# Patient Record
Sex: Male | Born: 1960 | Hispanic: No | Marital: Married | Smoking: Current every day smoker
Health system: Southern US, Community
[De-identification: ages and names within clinical notes are randomized; demographics above are authoritative.]

## PROBLEM LIST (undated history)

## (undated) DIAGNOSIS — D369 Benign neoplasm, unspecified site: Secondary | ICD-10-CM

## (undated) DIAGNOSIS — K649 Unspecified hemorrhoids: Secondary | ICD-10-CM

## (undated) HISTORY — PX: HEMORRHOID BANDING: SHX5850

## (undated) HISTORY — DX: Benign neoplasm, unspecified site: D36.9

## (undated) HISTORY — PX: OTHER SURGICAL HISTORY: SHX169

---

## 1999-05-25 ENCOUNTER — Encounter: Admission: RE | Admit: 1999-05-25 | Discharge: 1999-05-25 | Payer: Self-pay

## 2002-08-03 ENCOUNTER — Ambulatory Visit (HOSPITAL_COMMUNITY): Admission: RE | Admit: 2002-08-03 | Discharge: 2002-08-03 | Payer: Self-pay | Admitting: General Surgery

## 2007-05-30 ENCOUNTER — Ambulatory Visit (HOSPITAL_COMMUNITY): Admission: RE | Admit: 2007-05-30 | Discharge: 2007-05-30 | Payer: Self-pay | Admitting: Internal Medicine

## 2009-08-01 ENCOUNTER — Emergency Department (HOSPITAL_COMMUNITY): Admission: EM | Admit: 2009-08-01 | Discharge: 2009-08-01 | Payer: Self-pay | Admitting: Emergency Medicine

## 2010-12-01 NOTE — H&P (Signed)
   NAME:  MONTRE, HARBOR NO.:  [id number]   MEDICAL RECORD NO.:  [id number]                   PATIENT TYPE:   LOCATION:                                       FACILITY:   PHYSICIAN:  Dalia Heading, M.D.               DATE OF BIRTH:  [birth year]   DATE OF ADMISSION:  08/03/2002  DATE OF DISCHARGE:                                HISTORY & PHYSICAL   CHIEF COMPLAINT:  Right wrist ganglion cyst.   HISTORY OF PRESENT ILLNESS:  The patient is a 50 year old Hispanic male who  is referred for evaluation and treatment of a right ganglion cyst.  It has  been increasing in size over the past few weeks.  The patient is right  handed and is coming of some discomfort.   PAST MEDICAL HISTORY:  Unremarkable.   PAST SURGICAL HISTORY:  Unremarkable.   CURRENT MEDICATIONS:  None.   ALLERGIES:  No known drug allergies.   REVIEW OF SYSTEMS:  The patient does smoke a pack of cigarettes a day.  He  denies any other cardiopulmonary difficulties or bleeding disorders.   PHYSICAL EXAMINATION:  GENERAL:  The patient is a well-developed, well-  nourished Hispanic male in no acute distress.  VITAL SIGNS:  He is afebrile and vital signs are stable.  LUNGS:  Clear to auscultation with equal breath sounds bilaterally.  HEART:  Regular rate and rhythm without S3, S4, or murmurs.  EXTREMITIES:  Examination reveals a 2-cm soft, oval, cystic mass noted along  the dorsum of the right wrist.   IMPRESSION:  Ganglion cyst, right wrist.    PLAN:  The patient is scheduled for excision of the ganglion cyst, right  wrist, on August 03, 2002.  The risks and benefits of the procedure  including bleeding, infection, and recurrence of the cyst were fully  explained to the patient, gave informed consent.                                               Dalia Heading, M.D.    MAJ/MEDQ  D:  07/30/2002  T:  07/30/2002  Job:  [id number]   cc:   Corrie Mckusick, M.D.  [address]  [place]. [place]  [place]  [place] [address]  Fax: [phone]

## 2010-12-01 NOTE — Op Note (Signed)
   NAME:  Hector Ho, Hector Ho                        ACCOUNT NO.:  [id number]   MEDICAL RECORD NO.:  [id number]                   PATIENT TYPE:  AMB   LOCATION:  DAY                                  FACILITY:  [facility]   PHYSICIAN:  Dalia Heading, M.D.               DATE OF BIRTH:  1960/12/23   DATE OF PROCEDURE:  08/03/2002  DATE OF DISCHARGE:                                 OPERATIVE REPORT   PREOPERATIVE DIAGNOSIS:  Ganglion cyst, right wrist.   POSTOPERATIVE DIAGNOSIS:  Ganglion cyst, right wrist.   OPERATION PERFORMED:  Excision of ganglion cyst, right wrist.   SURGEON:  Dalia Heading, M.D.   ANESTHESIA:  Regional block.   INDICATIONS FOR PROCEDURE:  The patient is a 50 year old Hispanic male who  presents with a ganglion cyst along the posterior aspect of his right wrist.  The patient now comes to the operating room for excision of the ganglion  cyst of the right wrist.  The risks and benefits of the procedure including  bleeding, infection, and recurrence of the cyst were fully explained to the  patient, and he gave informed consent.   DESCRIPTION OF PROCEDURE:  The patient was placed in the supine position  after the right wrist was blocked using a Bier block.  This was performed by  Anesthesia.  The right hand was prepped and draped using the usual sterile  technique with Betadine.   A longitudinal incision was made over the cyst along the dorsal of the right  hand.  This was taken down to the base of the ganglion cyst.  A 5-0 nylon  suture ligature was placed at the base of the ganglion cyst.  The remaining  cystic tissue was then excised.  The wound was irrigated with normal saline.  The wound was injected with 0.5% Sensorcaine.  The skin was closed using a 5-  0 Vicryl subcuticular suture.  Steri-Strips and dry sterile dressing were  applied.  All tape and needle counts correct at the end of the procedure.  The patient was transferred to day surgery in stable  condition.   COMPLICATIONS:  None.   SPECIMENS:  Ganglion cyst, right wrist.    ESTIMATED BLOOD LOSS:  Minimal.                                                Dalia Heading, M.D.    MAJ/MEDQ  D:  08/03/2002  T:  08/03/2002  Job:  [id number]   cc:   Corrie Mckusick, M.D.  [address] [place]. [place]  [place]  [place] [address]  Fax: [phone]
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
## 2012-11-19 ENCOUNTER — Encounter (HOSPITAL_COMMUNITY): Payer: Self-pay

## 2012-11-19 ENCOUNTER — Ambulatory Visit (HOSPITAL_COMMUNITY)
Admission: RE | Admit: 2012-11-19 | Discharge: 2012-11-19 | Disposition: A | Discharge status: Home / Self Care | Payer: BC Managed Care – PPO | Source: Ambulatory Visit | Attending: Cardiovascular Disease | Admitting: Cardiovascular Disease

## 2012-11-19 DIAGNOSIS — R079 Chest pain, unspecified: Secondary | ICD-10-CM | POA: Insufficient documentation

## 2012-11-19 NOTE — Progress Notes (Signed)
Stress Lab Nurses Notes - Hector Ho  Hector Ho 11/19/2012 Reason for doing test: Chest Pain Type of test: Regular GTX Nurse performing test: Parke Poisson, RN Nuclear Medicine Tech: Not Applicable Echo Tech: Not Applicable MD performing test: Dr. Royann Shivers Family MD: Dr. Sherwood Gambler Test explained and consent signed: yes IV started: No IV started Symptoms: mild SOB Treatment/Intervention: None Reason test stopped: fatigue After recovery IV was: NA Patient to return to Nuc. Med at : NA Patient discharged: Home Patient's Condition upon discharge was: stable Comments: During test peak BP 146/97 & HR 144.  Recovery BP 115/74 & HR 88.  Symptoms resolved in recovery. [name] T

## 2012-11-19 NOTE — Procedures (Signed)
Procedure report  Exercise tolerance test  Procedure performed ZO:XWRUE Annaelle Kasel, MD, [facility]  Reason for procedure: Chest pain  The patient was initially monitored at rest and the heart rate was 59 beats per minute and the blood pressure was 110/70 mm Hg. An echocardiogram showed normal sinus rhythm without ST segment deviation signs of previous myocardial infarction or chamber enlargement  The patient underwent exercise treadmill stress testing using the standardized Bruce protocol. He exercised for a total of 12 minutes and 22 seconds, achieving a maximum workload of 14.7 METS. The peak heart rate was 144 beats per minute, representing 85% of the maximum predicted heart rate. The study was interrupted due to leg fatigue. Patient did not describe any anginal chest pain or dyspnea during the study. The blood pressure at peak exercise was 147/97 mm Hg  The electrocardiogram did not show any meaningful ST segment deviation.  Conclusion:  1. good exercise tolerance. 2. hypertensive response to exercise. 3. no electrocardiographic changes with exercise 4. no evidence of exercise-induced ischemia by electrocardiographic monitoring  Normal stress test.  Thurmon Fair, MD, [facility] [phone] office [phone] pager

## 2014-02-04 ENCOUNTER — Ambulatory Visit (HOSPITAL_COMMUNITY)
Admission: RE | Admit: 2014-02-04 | Discharge: 2014-02-04 | Disposition: A | Discharge status: Home / Self Care | Payer: BC Managed Care – PPO | Source: Ambulatory Visit | Attending: Internal Medicine | Admitting: Internal Medicine

## 2014-02-04 ENCOUNTER — Other Ambulatory Visit (HOSPITAL_COMMUNITY): Payer: Self-pay | Admitting: Internal Medicine

## 2014-02-04 DIAGNOSIS — R05 Cough: Secondary | ICD-10-CM

## 2014-02-04 DIAGNOSIS — Z0000 Encounter for general adult medical examination without abnormal findings: Secondary | ICD-10-CM | POA: Insufficient documentation

## 2014-02-04 DIAGNOSIS — R059 Cough, unspecified: Secondary | ICD-10-CM

## 2014-02-16 ENCOUNTER — Telehealth: Payer: Self-pay

## 2014-02-16 ENCOUNTER — Other Ambulatory Visit: Payer: Self-pay

## 2014-02-16 DIAGNOSIS — Z1211 Encounter for screening for malignant neoplasm of colon: Secondary | ICD-10-CM

## 2014-02-16 NOTE — Telephone Encounter (Signed)
Pt is calling to get her TCS set up. Her call back number is [phone].

## 2014-02-16 NOTE — Telephone Encounter (Signed)
Appropriate.

## 2014-02-16 NOTE — Telephone Encounter (Signed)
Gastroenterology Pre-Procedure Review  Request Date: 02/16/2014 Requesting Physician: Dr. Gerarda Fraction  PATIENT REVIEW QUESTIONS: The patient responded to the following health history questions as indicated:    1. Diabetes Melitis: Yes    DIET CONTROLLED 2. Joint replacements in the past 12 months: no 3. Major health problems in the past 3 months: no 4. Has an artificial valve or MVP: no 5. Has a defibrillator: no 6. Has been advised in past to take antibiotics in advance of a procedure like teeth cleaning: no    MEDICATIONS & ALLERGIES:    Patient reports the following regarding taking any blood thinners:   Plavix? no Aspirin? no Coumadin? no  Patient confirms/reports the following medications:  Current Outpatient Prescriptions  Medication Sig Dispense Refill  . ALPRAZolam (XANAX) 0.5 MG tablet Take 0.5 mg by mouth at bedtime as needed for anxiety. Has on hand if needed. Seldom uses it      . HYDROcodone-acetaminophen (NORCO) 10-325 MG per tablet Take 1 tablet by mouth every 6 (six) hours as needed. Has on hand if needed     Does not take often       No current facility-administered medications for this visit.    Patient confirms/reports the following allergies:  No Known Allergies  No orders of the defined types were placed in this encounter.    AUTHORIZATION INFORMATION Primary Insurance:   ID #:   Group #:  Pre-Cert / Auth required:  Pre-Cert / Auth #:   Secondary Insurance:   ID #:   Group #:  Pre-Cert / Auth required: Pre-Cert / Auth #:   SCHEDULE INFORMATION: Procedure has been scheduled as follows:  Date: 03/08/2014                  Time:  8:30 am Location: [facility] Short Stay  This Gastroenterology Pre-Precedure Review Form is being routed to the following provider(s): R. Garfield Cornea, MD

## 2014-02-18 ENCOUNTER — Encounter (HOSPITAL_COMMUNITY): Payer: Self-pay

## 2014-02-18 MED ORDER — PEG-KCL-NACL-NASULF-NA ASC-C 100 G PO SOLR: 1.0000 | ORAL | Status: DC

## 2014-02-18 NOTE — Telephone Encounter (Signed)
Rx sent to the pharmacy and instructions mailed to pt.  

## 2014-03-08 ENCOUNTER — Encounter (HOSPITAL_COMMUNITY)
Admission: RE | Disposition: A | Discharge status: Home / Self Care | Payer: Self-pay | Source: Ambulatory Visit | Attending: Internal Medicine

## 2014-03-08 ENCOUNTER — Encounter: Payer: Self-pay | Admitting: Internal Medicine

## 2014-03-08 ENCOUNTER — Ambulatory Visit (HOSPITAL_COMMUNITY)
Admission: RE | Admit: 2014-03-08 | Discharge: 2014-03-08 | Disposition: A | Discharge status: Home / Self Care | Payer: BC Managed Care – PPO | Source: Ambulatory Visit | Attending: Internal Medicine | Admitting: Internal Medicine

## 2014-03-08 ENCOUNTER — Encounter (HOSPITAL_COMMUNITY): Payer: Self-pay | Admitting: *Deleted

## 2014-03-08 DIAGNOSIS — F17200 Nicotine dependence, unspecified, uncomplicated: Secondary | ICD-10-CM | POA: Diagnosis not present

## 2014-03-08 DIAGNOSIS — D126 Benign neoplasm of colon, unspecified: Secondary | ICD-10-CM

## 2014-03-08 DIAGNOSIS — F411 Generalized anxiety disorder: Secondary | ICD-10-CM | POA: Insufficient documentation

## 2014-03-08 DIAGNOSIS — Z79899 Other long term (current) drug therapy: Secondary | ICD-10-CM | POA: Diagnosis not present

## 2014-03-08 DIAGNOSIS — K648 Other hemorrhoids: Secondary | ICD-10-CM | POA: Diagnosis not present

## 2014-03-08 DIAGNOSIS — Z86010 Personal history of colonic polyps: Secondary | ICD-10-CM

## 2014-03-08 DIAGNOSIS — Z1211 Encounter for screening for malignant neoplasm of colon: Secondary | ICD-10-CM

## 2014-03-08 DIAGNOSIS — K921 Melena: Secondary | ICD-10-CM | POA: Diagnosis not present

## 2014-03-08 HISTORY — DX: Unspecified hemorrhoids: K64.9

## 2014-03-08 HISTORY — PX: COLONOSCOPY: SHX5424

## 2014-03-08 SURGERY — COLONOSCOPY
Anesthesia: Moderate Sedation

## 2014-03-08 MED ORDER — MIDAZOLAM HCL 5 MG/5ML IJ SOLN
INTRAMUSCULAR | Status: DC | PRN
  Administered 2014-03-08 (×2): 2 mg via INTRAVENOUS

## 2014-03-08 MED ORDER — MEPERIDINE HCL 100 MG/ML IJ SOLN
INTRAMUSCULAR | Status: AC
  Filled 2014-03-08: qty 2

## 2014-03-08 MED ORDER — STERILE WATER FOR IRRIGATION IR SOLN
Status: DC | PRN
  Administered 2014-03-08: 08:00:00

## 2014-03-08 MED ORDER — MIDAZOLAM HCL 5 MG/5ML IJ SOLN
INTRAMUSCULAR | Status: AC
  Filled 2014-03-08: qty 10

## 2014-03-08 MED ORDER — SODIUM CHLORIDE 0.9 % IV SOLN
INTRAVENOUS | Status: DC
  Administered 2014-03-08: 08:00:00 via INTRAVENOUS

## 2014-03-08 MED ORDER — MEPERIDINE HCL 100 MG/ML IJ SOLN
INTRAMUSCULAR | Status: DC | PRN
  Administered 2014-03-08: 50 mg via INTRAVENOUS
  Administered 2014-03-08: 25 mg via INTRAVENOUS

## 2014-03-08 MED ORDER — ONDANSETRON HCL 4 MG/2ML IJ SOLN
INTRAMUSCULAR | Status: AC
  Filled 2014-03-08: qty 2

## 2014-03-08 MED ORDER — ONDANSETRON HCL 4 MG/2ML IJ SOLN
INTRAMUSCULAR | Status: DC | PRN
  Administered 2014-03-08: 4 mg via INTRAVENOUS

## 2014-03-08 NOTE — H&P (Signed)
@  VV[facility]@   Primary Care Physician:  Glo Herring., MD Primary Gastroenterologist:  Dr. Gala Romney  Pre-Procedure History & Physical: HPI:  Hector Ho is a 53 y.o. male is here for a screening colonoscopy. Occasional paper hematochezia; otherwise, no bowel symptoms. No family history of colon cancer. No prior colonoscopy  Past Medical History  Diagnosis Date  . Hemorrhoids     Past Surgical History  Procedure Laterality Date  . Cyst removed from right hand      Prior to Admission medications   Medication Sig Start Date End Date Taking? Authorizing Provider  ALPRAZolam Duanne Moron) 0.5 MG tablet Take 0.5 mg by mouth at bedtime as needed for anxiety.    Yes Historical Provider, MD  ciprofloxacin (CIPRO) 500 MG tablet Take 500 mg by mouth 2 (two) times daily. For 14 days, started on 02/04/2014   Yes Historical Provider, MD  HYDROcodone-acetaminophen (NORCO) 10-325 MG per tablet Take 1 tablet by mouth every 6 (six) hours as needed for moderate pain.    Yes Historical Provider, MD    Allergies as of 02/16/2014  . (No Known Allergies)    Family History  Problem Relation Age of Onset  . Family history unknown: Yes    History   Social History  . Marital Status: Married    Spouse Name: N/A    Number of Children: N/A  . Years of Education: N/A   Occupational History  . Not on file.   Social History Main Topics  . Smoking status: Current Every Day Smoker -- 0.50 packs/day for 41 years    Types: Cigarettes  . Smokeless tobacco: Not on file  . Alcohol Use: No  . Drug Use: No  . Sexual Activity: Not on file   Other Topics Concern  . Not on file   Social History Narrative  . No narrative on file    Review of Systems: See HPI, otherwise negative ROS  Physical Exam: BP 115/78  Pulse 67  Temp(Src) 98.2 F (36.8 C) (Oral)  Resp 20  Ht 5\' 8"  (1.727 m)  Wt 153 lb (69.4 kg)  BMI 23.27 kg/m2  SpO2 99% General:   Alert,  Well-developed, well-nourished, pleasant and  cooperative in NAD Head:  Normocephalic and atraumatic. Eyes:  Sclera clear, no icterus.   Conjunctiva pink. Ears:  Normal auditory acuity. Nose:  No deformity, discharge,  or lesions. Mouth:  No deformity or lesions, dentition normal. Neck:  Supple; no masses or thyromegaly. Lungs:  Clear throughout to auscultation.   No wheezes, crackles, or rhonchi. No acute distress. Heart:  Regular rate and rhythm; no murmurs, clicks, rubs,  or gallops. Abdomen:  Soft, nontender and nondistended. No masses, hepatosplenomegaly or hernias noted. Normal bowel sounds, without guarding, and without rebound.   Msk:  Symmetrical without gross deformities. Normal posture. Pulses:  Normal pulses noted. Extremities:  Without clubbing or edema. Neurologic:  Alert and  oriented x4;  grossly normal neurologically. Skin:  Intact without significant lesions or rashes. Cervical Nodes:  No significant cervical adenopathy. Psych:  Alert and cooperative. Normal mood and affect.  Impression/Plan: Hector Ho is now here to undergo a screening colonoscopy. Paper hematochezia. Risks, benefits, limitations, imponderables and alternatives regarding colonoscopy have been reviewed with the patient. Questions have been answered. All parties agreeable.     Notice:  This dictation was prepared with Dragon dictation along with smaller phrase technology. Any transcriptional errors that result from this process are unintentional and may not be corrected upon review.

## 2014-03-08 NOTE — Op Note (Signed)
[facility] [address] [place], [address]   COLONOSCOPY PROCEDURE REPORT  PATIENT: Ho, Hector  MR#:         [id number] BIRTHDATE: 08-25-1960 , 53  yrs. old GENDER: Male ENDOSCOPIST: R.  Garfield Cornea, MD FACP FACG REFERRED BY:  Kerin Perna, M.D. PROCEDURE DATE:  03/08/2014 PROCEDURE:     Colonoscopy with snare polypectomy  INDICATIONS: First ever average risk screening colonoscopy; paper hematochezia  INFORMED CONSENT:  The risks, benefits, alternatives and imponderables including but not limited to bleeding, perforation as well as the possibility of a missed lesion have been reviewed.  The potential for biopsy, lesion removal, etc. have also been discussed.  Questions have been answered.  All parties agreeable. Please see the history and physical in the medical record for more information.  MEDICATIONS: Versed 4 mg IV and Demerol 75 mg IV in divided doses. Zofran 4 mg IV.  DESCRIPTION OF PROCEDURE:  After a digital rectal exam was performed, the EC-[id number] ([id number])  colonoscope was advanced from the anus through the rectum and colon to the area of the cecum, ileocecal valve and appendiceal orifice.  The cecum was deeply intubated.  These structures were well-seen and photographed for the record.  From the level of the cecum and ileocecal valve, the scope was slowly and cautiously withdrawn.  The mucosal surfaces were carefully surveyed utilizing scope tip deflection to facilitate fold flattening as needed.  The scope was pulled down into the rectum where a thorough examination including retroflexion was performed.    FINDINGS:  Adequate preparation.   excoriated internal hemorrhoids; otherwise normal rectum (1) 6 mm sessile polyp at splenic flexure; otherwise, the remainder of colonic mucosa appeared normal.  THERAPEUTIC / DIAGNOSTIC MANEUVERS PERFORMED:  The above-mentioned polyp was hot snare removed and recovered for the  pathologist  COMPLICATIONS: none  CECAL WITHDRAWAL TIME:  9 minutes  IMPRESSION:  Excoriated anal canal/internal hemorrhoids-likely source of hematochezia. Single colonic polyp-removed as described above  RECOMMENDATIONS:  Followup on pathology. Office visit in 3-4 weeks to embark on hemorrhoid banding.   _______________________________ eSigned:  R. Garfield Cornea, MD FACP [facility] 03/08/2014 9:10 AM   CC:

## 2014-03-08 NOTE — Discharge Instructions (Signed)
Colonoscopy Discharge Instructions  Read the instructions outlined below and refer to this sheet in the next few weeks. These discharge instructions provide you with general information on caring for yourself after you leave the hospital. Your doctor may also give you specific instructions. While your treatment has been planned according to the most current medical practices available, unavoidable complications occasionally occur. If you have any problems or questions after discharge, call Dr. Gala Romney at [phone]. ACTIVITY  You may resume your regular activity, but move at a slower pace for the next 24 hours.   Take frequent rest periods for the next 24 hours.   Walking will help get rid of the air and reduce the bloated feeling in your belly (abdomen).   No driving for 24 hours (because of the medicine (anesthesia) used during the test).    Do not sign any important legal documents or operate any machinery for 24 hours (because of the anesthesia used during the test).  NUTRITION  Drink plenty of fluids.   You may resume your normal diet as instructed by your doctor.   Begin with a light meal and progress to your normal diet. Heavy or fried foods are harder to digest and may make you feel sick to your stomach (nauseated).   Avoid alcoholic beverages for 24 hours or as instructed.  MEDICATIONS  You may resume your normal medications unless your doctor tells you otherwise.  WHAT YOU CAN EXPECT TODAY  Some feelings of bloating in the abdomen.   Passage of more gas than usual.   Spotting of blood in your stool or on the toilet paper.  IF YOU HAD POLYPS REMOVED DURING THE COLONOSCOPY:  No aspirin products for 7 days or as instructed.   No alcohol for 7 days or as instructed.   Eat a soft diet for the next 24 hours.  FINDING OUT THE RESULTS OF YOUR TEST Not all test results are available during your visit. If your test results are not back during the visit, make an appointment  with your caregiver to find out the results. Do not assume everything is normal if you have not heard from your caregiver or the medical facility. It is important for you to follow up on all of your test results.  SEEK IMMEDIATE MEDICAL ATTENTION IF:  You have more than a spotting of blood in your stool.   Your belly is swollen (abdominal distention).   You are nauseated or vomiting.   You have a temperature over 101.   You have abdominal pain or discomfort that is severe or gets worse throughout the day.    Hemorrhoids Hemorrhoids are puffy (swollen) veins around the rectum or anus. Hemorrhoids can cause pain, itching, bleeding, or irritation. HOME CARE  Eat foods with fiber, such as whole grains, beans, nuts, fruits, and vegetables. Ask your doctor about taking products with added fiber in them (fibersupplements).  Drink enough fluid to keep your pee (urine) clear or pale yellow.  Exercise often.  Go to the bathroom when you have the urge to poop. Do not wait.  Avoid straining to poop (bowel movement).  Keep the butt area dry and clean. Use wet toilet paper or moist paper towels.  Medicated creams and medicine inserted into the anus (anal suppository) may be used or applied as told.  Only take medicine as told by your doctor.  Take a warm water bath (sitz bath) for 15-20 minutes to ease pain. Do this 3-4 times a day.  Place  ice packs on the area if it is tender or puffy. Use the ice packs between the warm water baths.  Put ice in a plastic bag.  Place a towel between your skin and the bag.  Leave the ice on for 15-20 minutes, 03-04 times a day.  Do not use a donut-shaped pillow or sit on the toilet for a long time. GET HELP RIGHT AWAY IF:   You have more pain that is not controlled by treatment or medicine.  You have bleeding that will not stop.  You have trouble or are unable to poop (bowel movement).  You have pain or puffiness outside the area of the  hemorrhoids. MAKE SURE YOU:   Understand these instructions.  Will watch your condition.  Will get help right away if you are not doing well or get worse. Document Released: 04/10/2008 Document Revised: 06/18/2012 Document Reviewed: 05/13/2012 [facility] Patient Information 2015 [place], [place]. This information is not intended to replace advice given to you by your health care provider. Make sure you discuss any questions you have with your health care provider. Colon Polyps Polyps are lumps of extra tissue growing inside the body. Polyps can grow in the large intestine (colon). Most colon polyps are noncancerous (benign). However, some colon polyps can become cancerous over time. Polyps that are larger than a pea may be harmful. To be safe, caregivers remove and test all polyps. CAUSES  Polyps form when mutations in the genes cause your cells to grow and divide even though no more tissue is needed. RISK FACTORS There are a number of risk factors that can increase your chances of getting colon polyps. They include:  Being older than 50 years.  Family history of colon polyps or colon cancer.  Long-term colon diseases, such as colitis or Crohn disease.  Being overweight.  Smoking.  Being inactive.  Drinking too much alcohol. SYMPTOMS  Most small polyps do not cause symptoms. If symptoms are present, they may include:  Blood in the stool. The stool may look dark red or black.  Constipation or diarrhea that lasts longer than 1 week. DIAGNOSIS People often do not know they have polyps until their caregiver finds them during a regular checkup. Your caregiver can use 4 tests to check for polyps:  Digital rectal exam. The caregiver wears gloves and feels inside the rectum. This test would find polyps only in the rectum.  Barium enema. The caregiver puts a liquid called barium into your rectum before taking X-rays of your colon. Barium makes your colon look white. Polyps are dark, so  they are easy to see in the X-ray pictures.  Sigmoidoscopy. A thin, flexible tube (sigmoidoscope) is placed into your rectum. The sigmoidoscope has a light and tiny camera in it. The caregiver uses the sigmoidoscope to look at the last third of your colon.  Colonoscopy. This test is like sigmoidoscopy, but the caregiver looks at the entire colon. This is the most common method for finding and removing polyps. TREATMENT  Any polyps will be removed during a sigmoidoscopy or colonoscopy. The polyps are then tested for cancer. PREVENTION  To help lower your risk of getting more colon polyps:  Eat plenty of fruits and vegetables. Avoid eating fatty foods.  Do not smoke.  Avoid drinking alcohol.  Exercise every day.  Lose weight if recommended by your caregiver.  Eat plenty of calcium and folate. Foods that are rich in calcium include milk, cheese, and broccoli. Foods that are rich in folate include  chickpeas, kidney beans, and spinach. HOME CARE INSTRUCTIONS Keep all follow-up appointments as directed by your caregiver. You may need periodic exams to check for polyps. SEEK MEDICAL CARE IF: You notice bleeding during a bowel movement. Document Released: 03/28/2004 Document Revised: 09/24/2011 Document Reviewed: 09/11/2011 [facility] Patient Information 2015 [place], [place]. This information is not intended to replace advice given to you by your health care provider. Make sure you discuss any questions you have with your health care provider.  Polyp and hemorrhoid information provided  Further recommendations to follow pending review of pathology report.  Schedule first hemorrhoid banding session with me in the office in about 3-4 weeks from now

## 2014-03-11 ENCOUNTER — Encounter (HOSPITAL_COMMUNITY): Payer: Self-pay | Admitting: Internal Medicine

## 2014-03-14 ENCOUNTER — Encounter: Payer: Self-pay | Admitting: Internal Medicine

## 2014-04-06 ENCOUNTER — Encounter: Payer: Self-pay | Admitting: Internal Medicine

## 2014-04-06 ENCOUNTER — Ambulatory Visit (INDEPENDENT_AMBULATORY_CARE_PROVIDER_SITE_OTHER): Payer: BC Managed Care – PPO | Admitting: Internal Medicine

## 2014-04-06 VITALS — BP 114/73 | HR 77 | Temp 97.4°F | Ht 68.0 in | Wt 158.2 lb

## 2014-04-06 DIAGNOSIS — K648 Other hemorrhoids: Secondary | ICD-10-CM

## 2014-04-06 NOTE — Progress Notes (Signed)
Robbins banding procedure note:  The patient presents with symptomatic grade 2 hemorrhoids unresponsive to maximal medical therapy requesting rubber band ligation of his/her hemorrhoidal disease. All risks, benefits, and alternative forms of therapy were described and informed consent was obtained.  In the left lateral decubitus position, a digital rectal exam was performed.  The decision was made to band the Right anterior and left lateral internal hemorrhoid the [place] was used to perform band ligation without complication. Digital anorectal examination was then performed to assure proper positioning of the band; bands were found to be in excellent position. A pitcher pain after deployment. The patient was discharged home without pain or other issues. Dietary and behavioral recommendations were given.  Office visit in 3-4 weeks.  No complications were encountered and the patient tolerated the procedure well.

## 2014-04-06 NOTE — Patient Instructions (Signed)
Avoid straining.  Benefiber 2 teaspoons twice daily  Limit toilet time to 2-3 minutes  Call with any interim problems  Schedule followup appointment in 2-3 weeks from now   

## 2014-04-12 ENCOUNTER — Telehealth (INDEPENDENT_AMBULATORY_CARE_PROVIDER_SITE_OTHER): Payer: Self-pay | Admitting: Internal Medicine

## 2014-04-12 MED ORDER — HYDROCORTISONE ACETATE 25 MG RE SUPP: 25.0000 mg | Freq: Every day | RECTAL | Status: DC

## 2014-04-12 NOTE — Telephone Encounter (Signed)
Noted. Let's call and check on the patient today please

## 2014-04-12 NOTE — Telephone Encounter (Signed)
Patient's wife called yesterday stating patient was having rectal pain and bleeding would not come to emergency room. Patient had been doing recently by Dr. Gala Romney. I told patient that I would send prescription for [facility] suppositories to [place].

## 2014-04-15 NOTE — Telephone Encounter (Signed)
Late entry, I have been unable to get in touch with the pt. Finally talked to the wife today and she said the pt wasn't home and she has been sick and hasnt talked to him so she is not sure how he is.

## 2014-05-04 ENCOUNTER — Encounter: Payer: BC Managed Care – PPO | Admitting: Internal Medicine

## 2014-05-04 ENCOUNTER — Encounter: Payer: Self-pay | Admitting: Internal Medicine

## 2015-02-03 ENCOUNTER — Encounter: Payer: Self-pay | Admitting: *Deleted

## 2015-03-10 ENCOUNTER — Encounter: Payer: Self-pay | Admitting: Internal Medicine

## 2016-01-23 IMAGING — CR DG CHEST 2V
2 series · 2 of 2 positions shown · non-contrast
Comparison: None.

CLINICAL DATA: Physical exam

EXAM:
CHEST  2 VIEW

[view not recorded (1 of 2)]
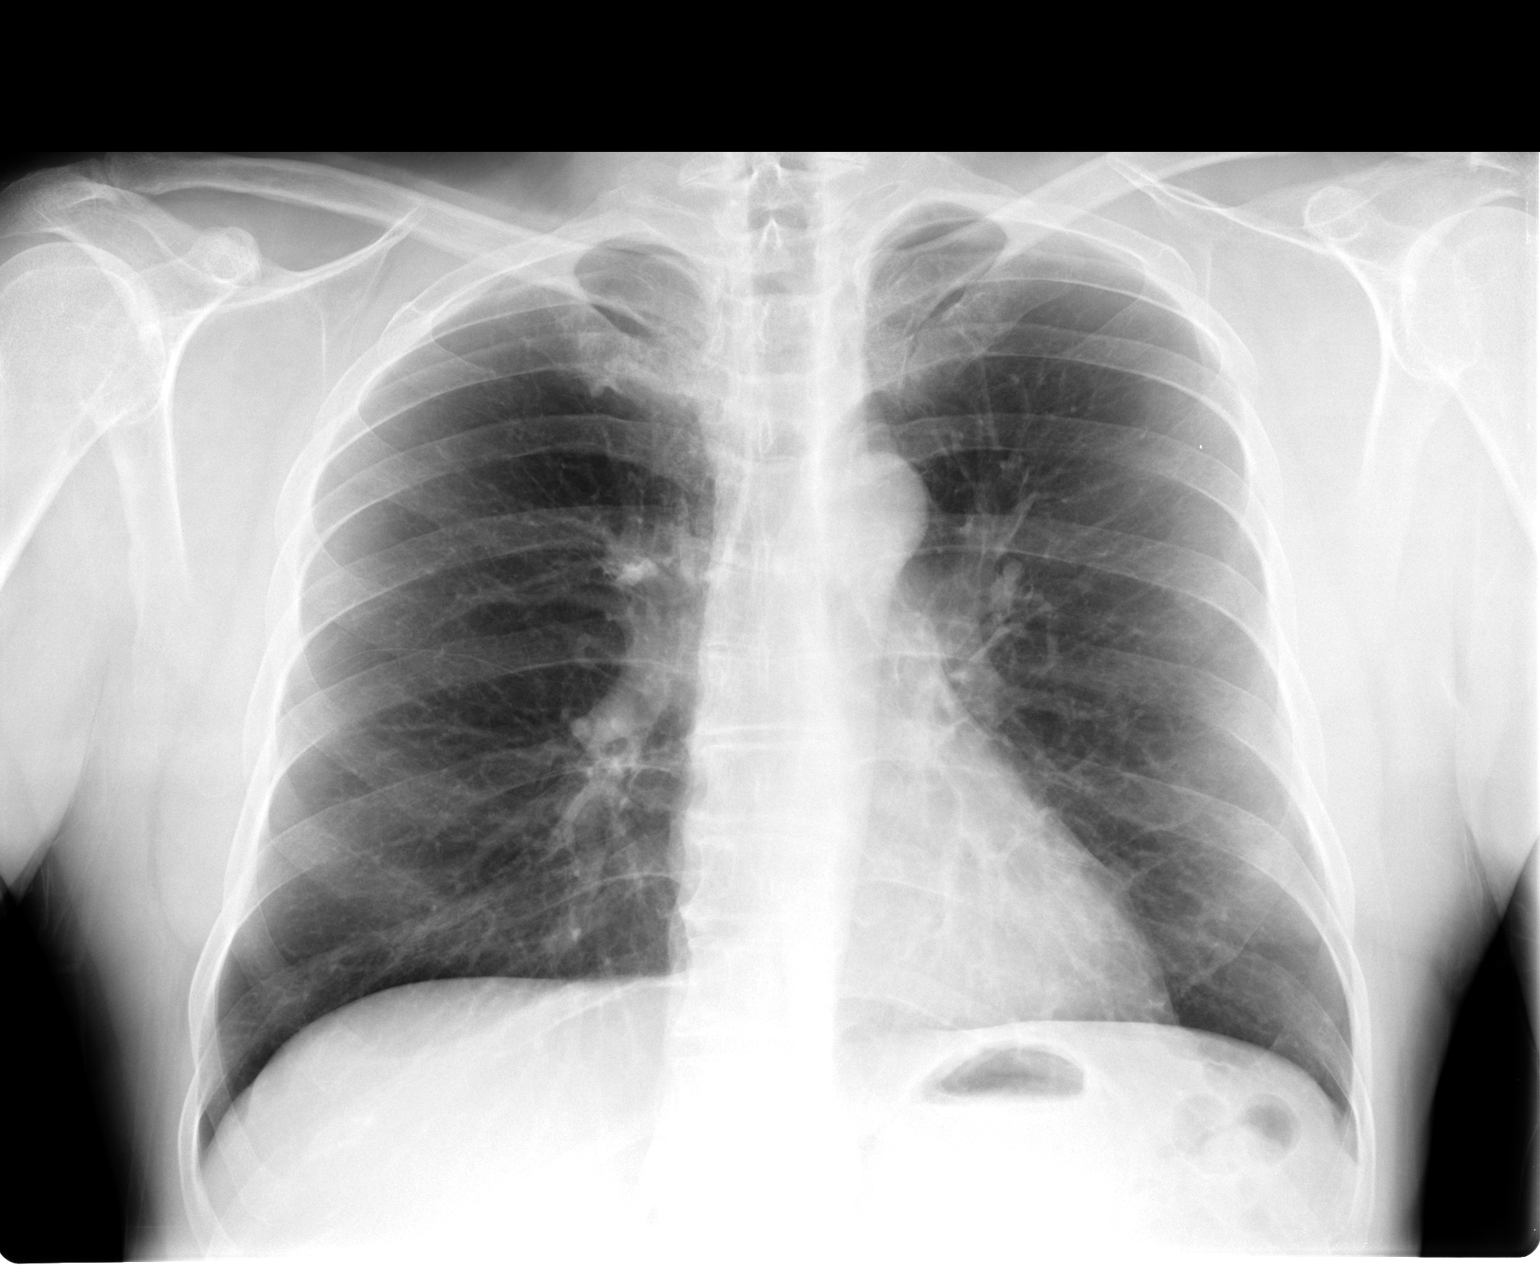

[view not recorded (2 of 2)]
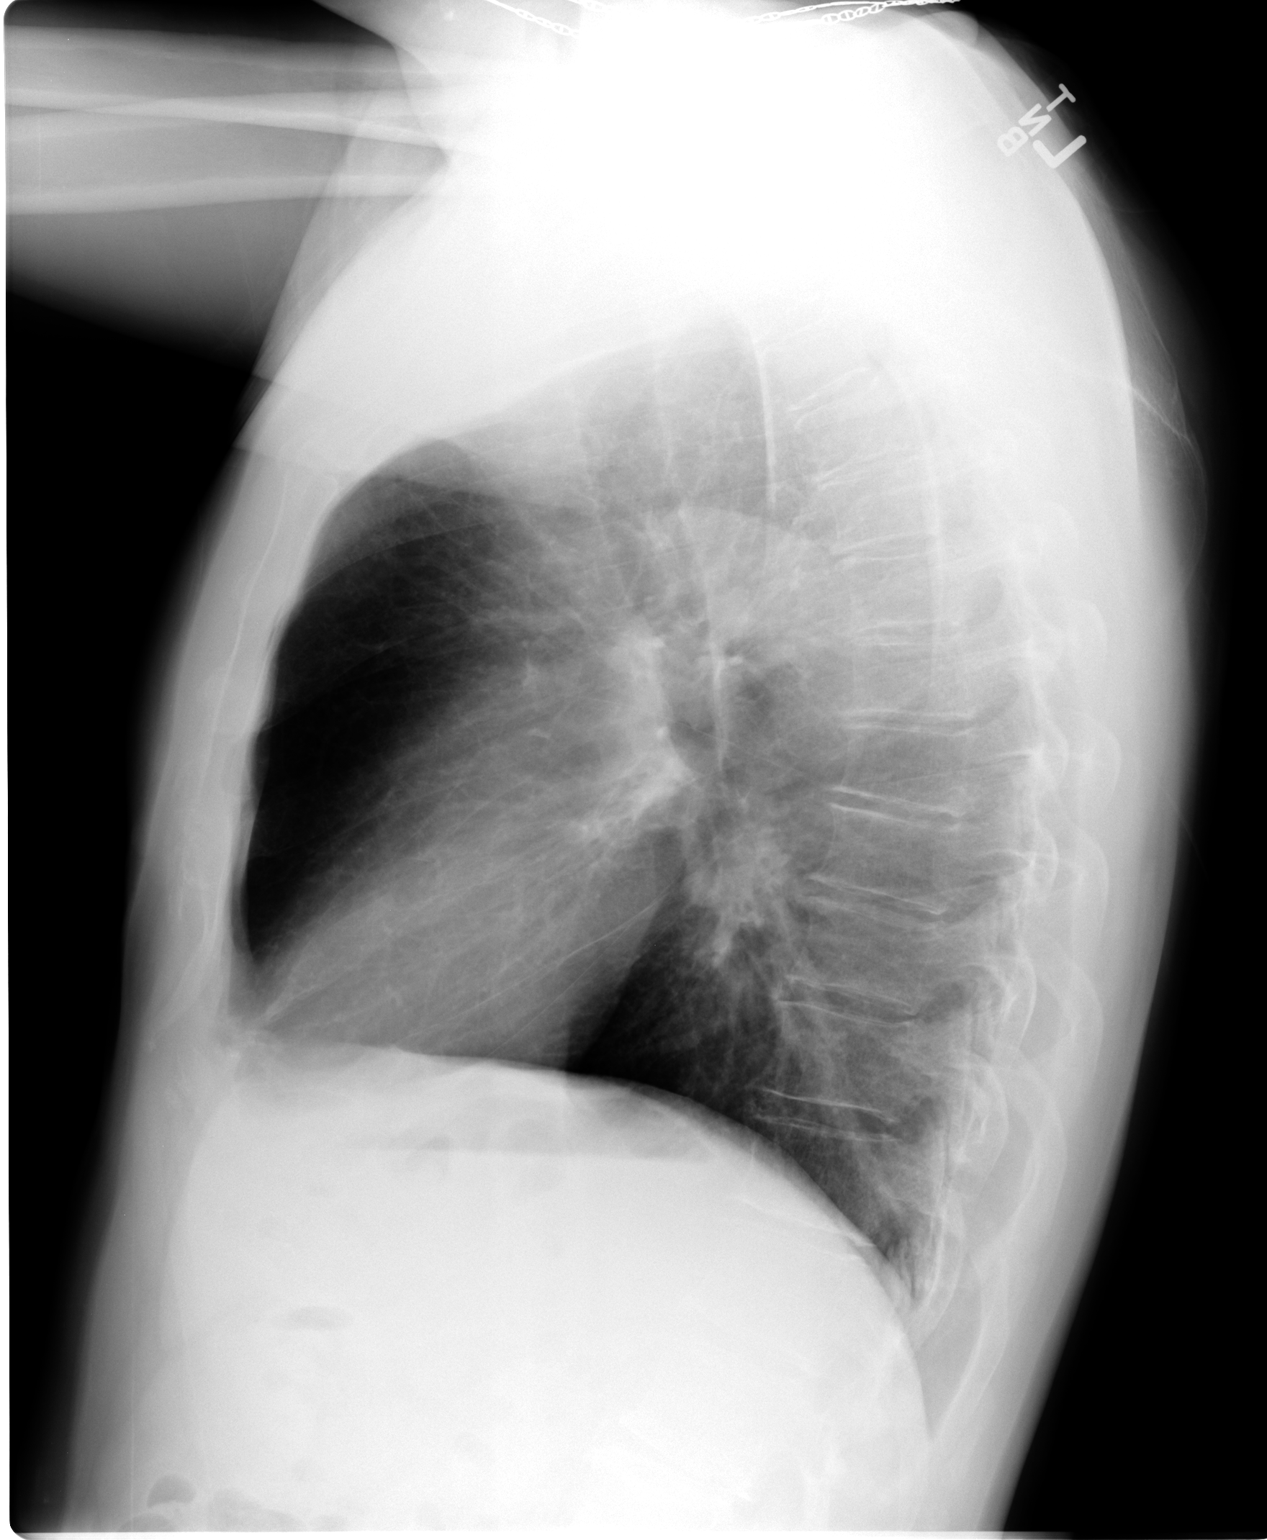

[2 of 2 positions shown; findings below may reference images not displayed]

FINDINGS: The heart size and mediastinal contours are within normal limits.
Both lungs are clear. Minimal degenerative changes mid thoracic
spine.
IMPRESSION: No active cardiopulmonary disease.

## 2018-05-02 DIAGNOSIS — G894 Chronic pain syndrome: Secondary | ICD-10-CM | POA: Diagnosis not present

## 2018-05-02 DIAGNOSIS — Z0001 Encounter for general adult medical examination with abnormal findings: Secondary | ICD-10-CM | POA: Diagnosis not present

## 2018-05-02 DIAGNOSIS — L209 Atopic dermatitis, unspecified: Secondary | ICD-10-CM | POA: Diagnosis not present

## 2018-05-02 DIAGNOSIS — Z6824 Body mass index (BMI) 24.0-24.9, adult: Secondary | ICD-10-CM | POA: Diagnosis not present

## 2021-02-06 ENCOUNTER — Encounter: Payer: Self-pay | Admitting: *Deleted

## 2021-08-01 ENCOUNTER — Encounter: Payer: Self-pay | Admitting: Gastroenterology

## 2021-08-01 ENCOUNTER — Encounter: Payer: Self-pay | Admitting: Internal Medicine

## 2023-08-20 ENCOUNTER — Ambulatory Visit: Payer: Self-pay | Admitting: Orthopedic Surgery

## 2023-09-13 ENCOUNTER — Ambulatory Visit (INDEPENDENT_AMBULATORY_CARE_PROVIDER_SITE_OTHER): Payer: Self-pay | Admitting: Orthopedic Surgery

## 2023-09-13 ENCOUNTER — Encounter: Payer: Self-pay | Admitting: Orthopedic Surgery

## 2023-09-13 ENCOUNTER — Other Ambulatory Visit: Payer: Self-pay

## 2023-09-13 VITALS — BP 157/107 | HR 73 | Ht 67.0 in | Wt 157.0 lb

## 2023-09-13 DIAGNOSIS — M25562 Pain in left knee: Secondary | ICD-10-CM

## 2023-09-13 DIAGNOSIS — S83242A Other tear of medial meniscus, current injury, left knee, initial encounter: Secondary | ICD-10-CM

## 2023-09-13 NOTE — Progress Notes (Signed)
  Intake history:  Ht 5\' 7"  (1.702 m)   Wt 157 lb (71.2 kg)   BMI 24.59 kg/m  Body mass index is 24.59 kg/m.    WHAT ARE WE SEEING YOU FOR TODAY?   left knee(s)  How long has this bothered you? (DOI?DOS?WS?)  5weeks   Anticoag.  No  Diabetes No/ unknown does not go to the doctor  Heart disease No  Hypertension No unknown does not go to the doctor BP high today patient advised   SMOKING HX Yes  Kidney disease No  Any ALLERGIES ______________ Allergies  Allergen Reactions   Hydrocodone    ________________________________   Treatment:  Have you taken:  Tylenol No  Advil Yes  Had PT No  Had injection No  Other  _________________patches ________

## 2023-09-13 NOTE — Progress Notes (Signed)
   Subjective:    Patient ID: Hector Ho, male    DOB: September 14, 1960, 63 y.o.   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  He comes in complaining of medial knee pain occasionally this radiates to his hip with frequent giving way loss of full flexion unrelieved by Advil and topical pain patches  Knee Pain       Review of Systems  All other systems reviewed and are negative.      Objective:   Physical Exam Vitals and nursing note reviewed.  Constitutional:      Appearance: Normal appearance.  HENT:     Head: Normocephalic and atraumatic.  Eyes:     General: No scleral icterus.       Right eye: No discharge.        Left eye: No discharge.     Extraocular Movements: Extraocular movements intact.     Conjunctiva/sclera: Conjunctivae normal.     Pupils: Pupils are equal, round, and reactive to light.  Cardiovascular:     Rate and Rhythm: Normal rate.     Pulses: Normal pulses.  Musculoskeletal:     Comments: He does have an effusion in the left knee the skin is normal  He is tender over the medial joint line is a positive McMurray's sign for medial meniscus tear his knee is stable muscle tone is normal  Altered gait  Skin:    General: Skin is warm and dry.     Capillary Refill: Capillary refill takes less than 2 seconds.  Neurological:     General: No focal deficit present.     Mental Status: He is alert and oriented to person, place, and time.  Psychiatric:        Mood and Affect: Mood normal.        Behavior: Behavior normal.        Thought Content: Thought content normal.        Judgment: Judgment normal.           Assessment & Plan:   DG Knee AP/LAT W/Sunrise Left Result Date: 09/13/2023 X-rays left knee 5 weeks of knee pain X-rays show symmetric joint space narrowing without secondary bone changes Overall alignment is acceptable Impression mild OA left knee   Encounter  Diagnoses  Name Primary?   Acute pain of left knee Yes   Acute medial meniscus tear of left knee, initial encounter    We talked about nonoperative measures versus operative measures.  The patient does not think he can work with his knee bothering him like this.  Recommend MRI of the knee to assess for medial meniscus tear

## 2023-09-13 NOTE — Patient Instructions (Signed)
For MRI please go ahead and call to schedule your appointment with [facility] within at least one (1) week.   Central Scheduling [phone]

## 2023-10-03 ENCOUNTER — Ambulatory Visit (HOSPITAL_COMMUNITY)
Admission: RE | Admit: 2023-10-03 | Discharge: 2023-10-03 | Disposition: A | Discharge status: Home / Self Care | Payer: Self-pay | Source: Ambulatory Visit | Attending: Orthopedic Surgery | Admitting: Orthopedic Surgery

## 2023-10-03 DIAGNOSIS — M25562 Pain in left knee: Secondary | ICD-10-CM | POA: Insufficient documentation

## 2023-10-05 ENCOUNTER — Ambulatory Visit (HOSPITAL_COMMUNITY): Payer: Self-pay

## 2023-10-07 ENCOUNTER — Encounter: Payer: Self-pay | Admitting: Orthopedic Surgery

## 2023-10-07 ENCOUNTER — Ambulatory Visit (INDEPENDENT_AMBULATORY_CARE_PROVIDER_SITE_OTHER): Payer: Self-pay | Admitting: Orthopedic Surgery

## 2023-10-07 DIAGNOSIS — M25562 Pain in left knee: Secondary | ICD-10-CM

## 2023-10-07 DIAGNOSIS — M1712 Unilateral primary osteoarthritis, left knee: Secondary | ICD-10-CM

## 2023-10-07 DIAGNOSIS — M25462 Effusion, left knee: Secondary | ICD-10-CM

## 2023-10-07 DIAGNOSIS — S83242A Other tear of medial meniscus, current injury, left knee, initial encounter: Secondary | ICD-10-CM

## 2023-10-07 MED ORDER — METHYLPREDNISOLONE ACETATE 40 MG/ML IJ SUSP
40.0000 mg | Freq: Once | INTRAMUSCULAR | Status: AC
  Administered 2023-10-07: 40 mg via INTRA_ARTICULAR

## 2023-10-07 NOTE — Patient Instructions (Signed)
 Diagnosis   Severe arthritis  Subchondral fracture  Meniscus tear    Take 800 mg ibuprofen 3 times a day  Apply ice left knee 20 min every night  Return to work [employer]

## 2023-10-07 NOTE — Progress Notes (Signed)
   There were no vitals taken for this visit.  There is no height or weight on file to calculate BMI.  Chief Complaint  Patient presents with   Knee Pain    Left/ work in today to review MRI but report not in yet.     No diagnosis found.  DOI/DOS/ Date: ongoing    Worse

## 2023-10-07 NOTE — Progress Notes (Signed)
 Unscheduled acute work and follow-up visit  Encounter Diagnoses  Name Primary?   Acute pain of left knee Yes   Acute medial meniscus tear of left knee, initial encounter    Primary osteoarthritis of left knee    Effusion of knee joint, left     MRI left knee for possible meniscus tear was done  I read the MRI as follows  Severe arthritis medial compartment with meniscal extrusion Meniscal tear medial meniscus Degeneration lateral meniscus Large effusion Medial subchondral bone edema  Report will be included for completeness  Patient came in today after having increasing pain and swelling on Thursday.  He tried to go to work today could not and was sent home and he came here for reevaluation  He complains of swelling and pain left knee severe increase with weightbearing and it prevented him from working today.  He has a large joint effusion significant limp in his left leg his motion is limited by the joint effusion.  He has extensive tenderness on the medial compartment  We did perform aspiration and injection of the left knee we wrapped it with an Ace bandage and told him to take ibuprofen 803 times a day use ice at night and try to go back to work on Thursday  Patient is uninsured  Patient needs a knee replacement as arthroscopy in the setting does not improve pain or function   Procedure note aspiration injection left knee Diagnosis large effusion with arthritis acute pain  Medication 40 mg Depo-Medrol, lidocaine 1% 3 cc  Patient gave consent site was confirmed  We used the superolateral approach we cleaned the knee with alcohol used ethyl chloride for anesthesia and then went an 18-gauge needle aspirated about 35 cc of clear yellow fluid from the left knee injected the medication as described  We put a Band-Aid on it he had no complications or reactions to the medication we put an Ace wrap around the joint  MR Knee Left  Wo Contrast Result Date:  10/07/2023 CLINICAL DATA:  Left knee pain for 5 weeks. Evaluate for meniscal injury. EXAM: MRI OF THE LEFT KNEE WITHOUT CONTRAST TECHNIQUE: Multiplanar, multisequence MR imaging of the knee was performed. No intravenous contrast was administered. COMPARISON:  Left knee radiographs 09/13/2023 FINDINGS: MENISCI Medial meniscus:  Intact. Lateral meniscus: There is an oblique tear extending through the inferior articular surface of the middle third of the meniscal triangle of the posterior horn of the medial meniscus (sagittal series 12 images 9 through 12). There is attenuation of the free edge of the meniscal triangle in this region with a small tear extending through the superior articular surface of the central third of the meniscal triangle (sagittal series 5, image 12). Oblique undersurface tear within the middle and central thirds of the meniscal triangle of the posterior segment of the body of the medial meniscus (coronal images 15 and 16) with mild extrusion of meniscal tissue peripherally and inferiorly into the medial gutter. Minimal extrusion of the anterior segment of the body of the medial meniscus. Irregularity at the insertion of the transverse intermeniscal ligament onto the root of the anterior horn of the medial meniscus (sagittal series 12 images 12 through 14), likely complex degeneration/complex tear. LIGAMENTS Cruciates: There is intermediate proton density signal intrasubstance degeneration within the body of the lateral meniscus (coronal series 11, image 18 and sagittal series 2, image 26). This comes close to the superior articular surface of the meniscal triangle, however no definitive tear is seen extending  through an articular surface of the lateral meniscus. Collaterals: The medial collateral ligament is intact. The fibular collateral ligament, biceps femoris tendon, iliotibial band, and popliteus tendon are intact. CARTILAGE Patellofemoral: There are multiple curvilinear fissures within  the inferior medial patellar facet on axial series 8, image 12), both transverse and perpendicular to the cartilage surface and extending full-thickness. Medial: High-grade thinning of the medial aspect of medial tibial plateau cartilage with moderate subchondral marrow edema. There is also high-grade thinning of the far medial aspect of the weight-bearing medial femoral condyle cartilage with focal subchondral marrow edema (coronal series 10, image 12). Lateral: Moderate thinning of the lateral aspect of the weight-bearing lateral femoral condyle and lateral tibial plateau cartilage. Joint: Large joint effusion. Normal Hoffa's fat pad. No plical thickening. Popliteal Fossa:  No Baker's cyst. Extensor Mechanism: Mild intermediate T2 signal and thickening distal quadriceps tendinosis. Minimal proximal and distal patellar tendinosis. Bones:  No acute fracture or dislocation. Other: None. IMPRESSION: 1. Oblique tear extending through the inferior articular surface of the middle third of the meniscal triangle of the posterior horn of the medial meniscus. Attenuation of the free edge of the meniscal triangle in this region with a small tear extending through the superior articular surface of the central third of the meniscal triangle. Oblique undersurface tear within the middle and central thirds of the meniscal triangle of the posterior segment of the body of the medial meniscus with mild extrusion of meniscal tissue peripherally and inferiorly into the medial gutter. Complex degeneration/tear within the root of the anterior horn of the medial meniscus. 2. Intrasubstance degeneration within the body of the lateral meniscus. 3. Tricompartmental cartilage degenerative changes, worst within the medial compartment. 4. Large joint effusion. 5. Mild distal quadriceps tendinosis. Minimal proximal and distal patellar tendinosis. Electronically Signed   By: Neita Garnet M.D.   On: 10/07/2023 15:10   DG Knee AP/LAT W/Sunrise  Left Result Date: 09/13/2023 X-rays left knee 5 weeks of knee pain X-rays show symmetric joint space narrowing without secondary bone changes Overall alignment is acceptable Impression mild OA left knee
